# Patient Record
Sex: Male | Born: 1985 | Hispanic: No | Marital: Married
Health system: Southern US, Community
[De-identification: ages and names within clinical notes are randomized; demographics above are authoritative.]

## PROBLEM LIST (undated history)

## (undated) DIAGNOSIS — L309 Dermatitis, unspecified: Secondary | ICD-10-CM

## (undated) HISTORY — DX: Dermatitis, unspecified: L30.9

---
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
## 2007-10-05 ENCOUNTER — Emergency Department (HOSPITAL_COMMUNITY): Admission: EM | Admit: 2007-10-05 | Discharge: 2007-10-05 | Payer: Self-pay | Admitting: Emergency Medicine

## 2009-12-27 IMAGING — CR DG FOOT COMPLETE 3+V*L*
3 series · 3 of 3 positions shown · non-contrast
Comparison: No priors

CLINICAL DATA: Fell from a tree - pain in bilateral feet

LEFT FOOT - COMPLETE 3+ VIEW

[t foot ap left]
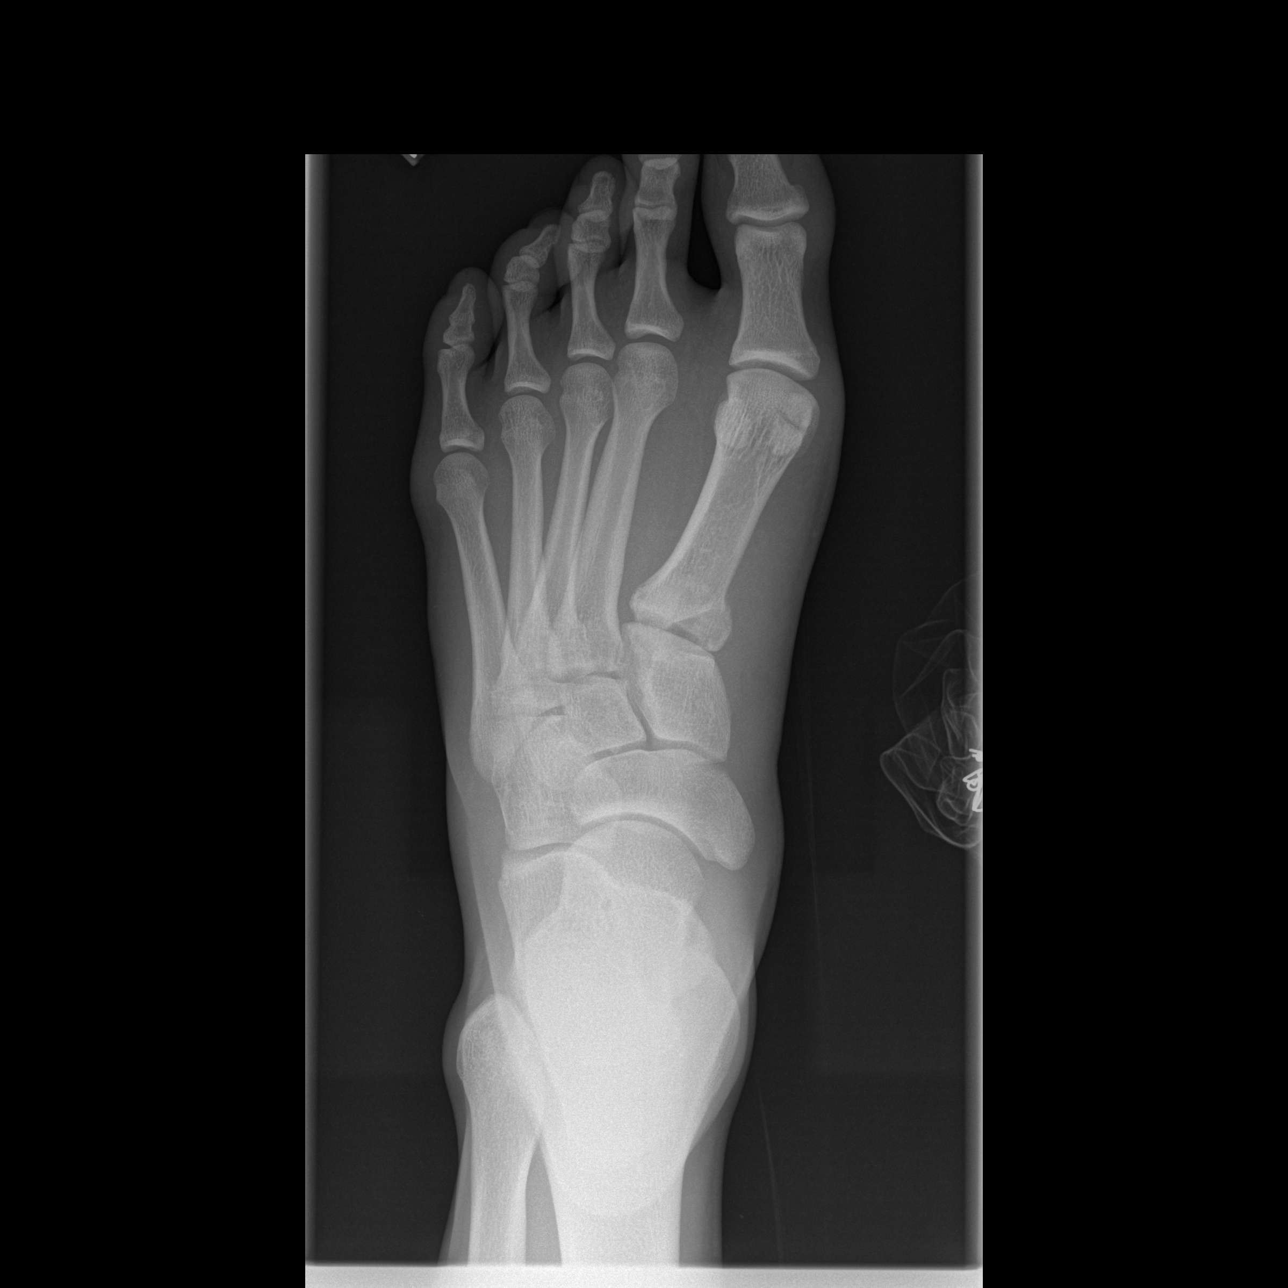

[t foot oblique left]
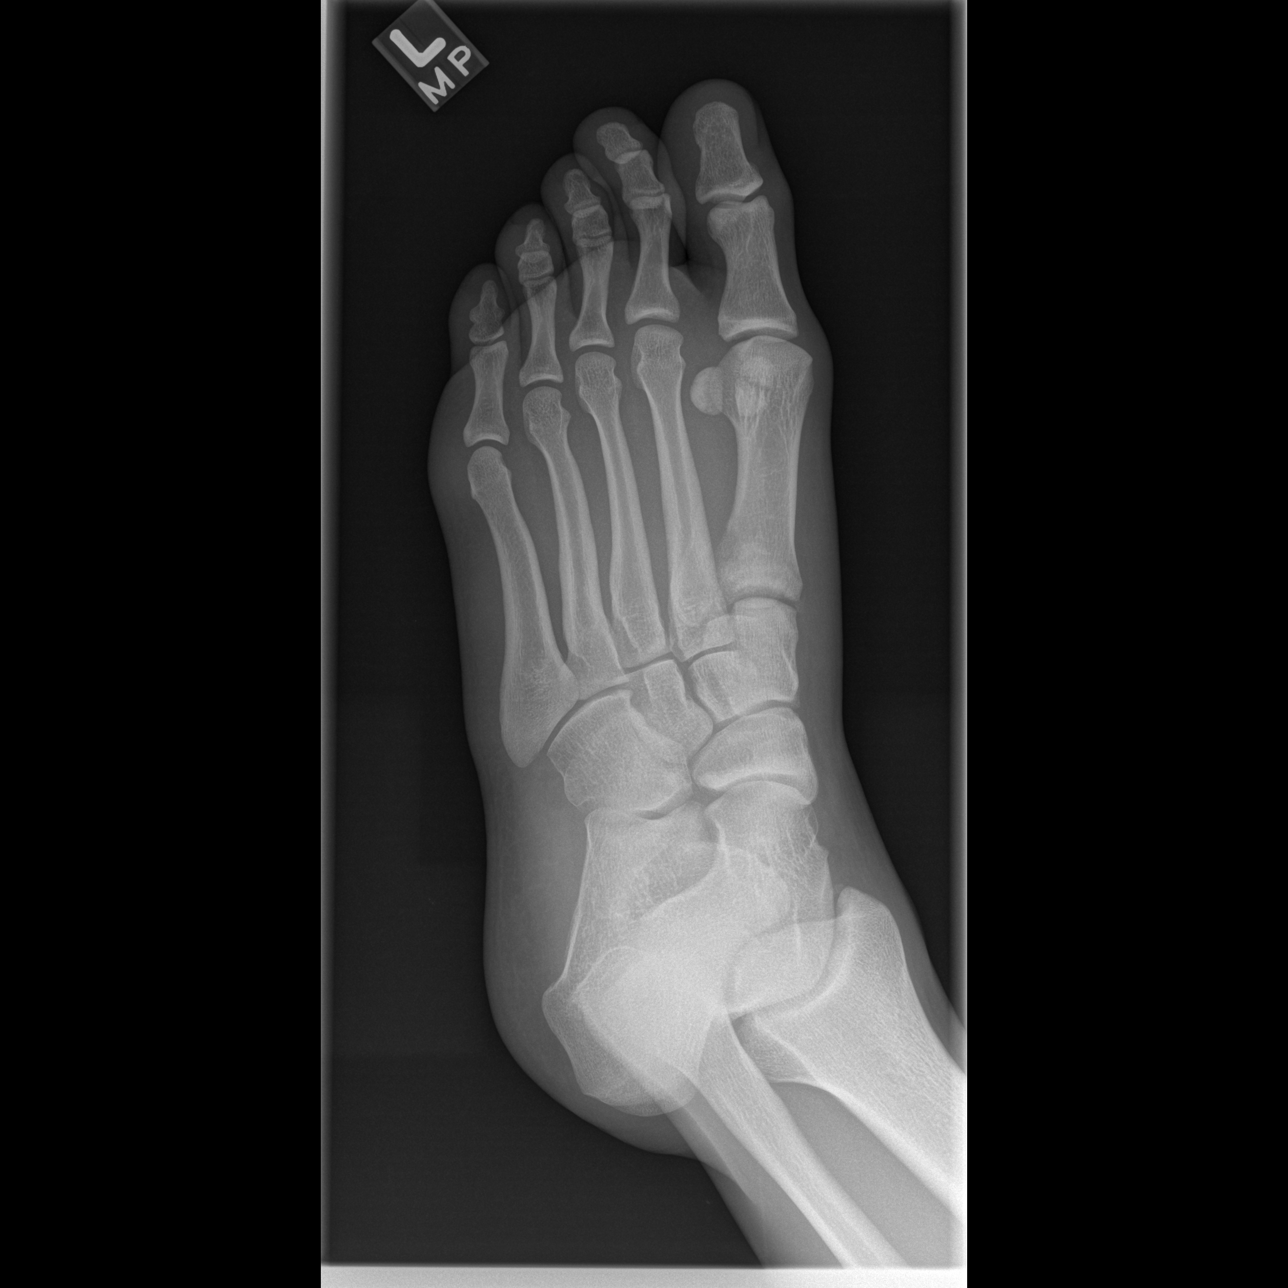

[t foot lat left]
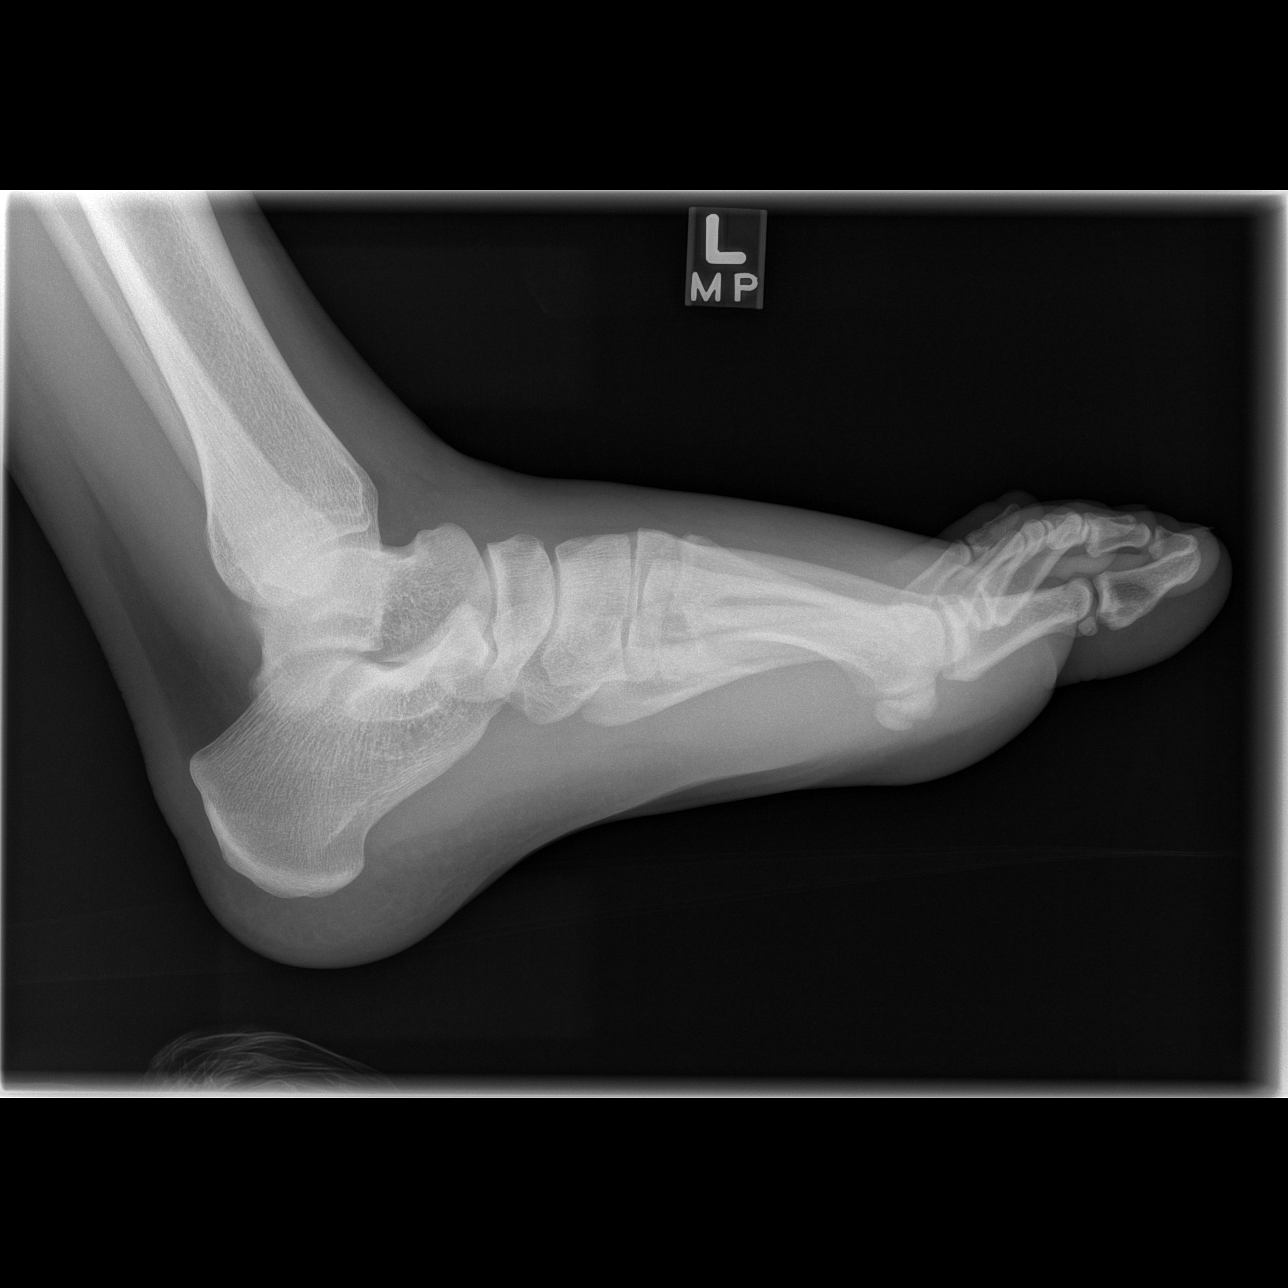

[3 of 3 positions shown; findings below may reference images not displayed]

FINDINGS: There is an abnormal contour to the base of the first
metatarsal suggesting a fracture.  Addition, there is possible
diastasis of the first and second cuneiforms. CT recommended for
further assessment.
IMPRESSION: 1.  Probable fracture at the base of the first metatarsal.
2.  Possible diastasis of the first and second cuneiforms.
3.  CT recommended for further assessment.

## 2009-12-27 IMAGING — CR DG FOOT COMPLETE 3+V*R*
3 series · 3 of 3 positions shown · non-contrast
Comparison: No priors

CLINICAL DATA: Fell out of tree - bilateral foot pain.

RIGHT FOOT COMPLETE - 3+ VIEW

[t foot ap right]
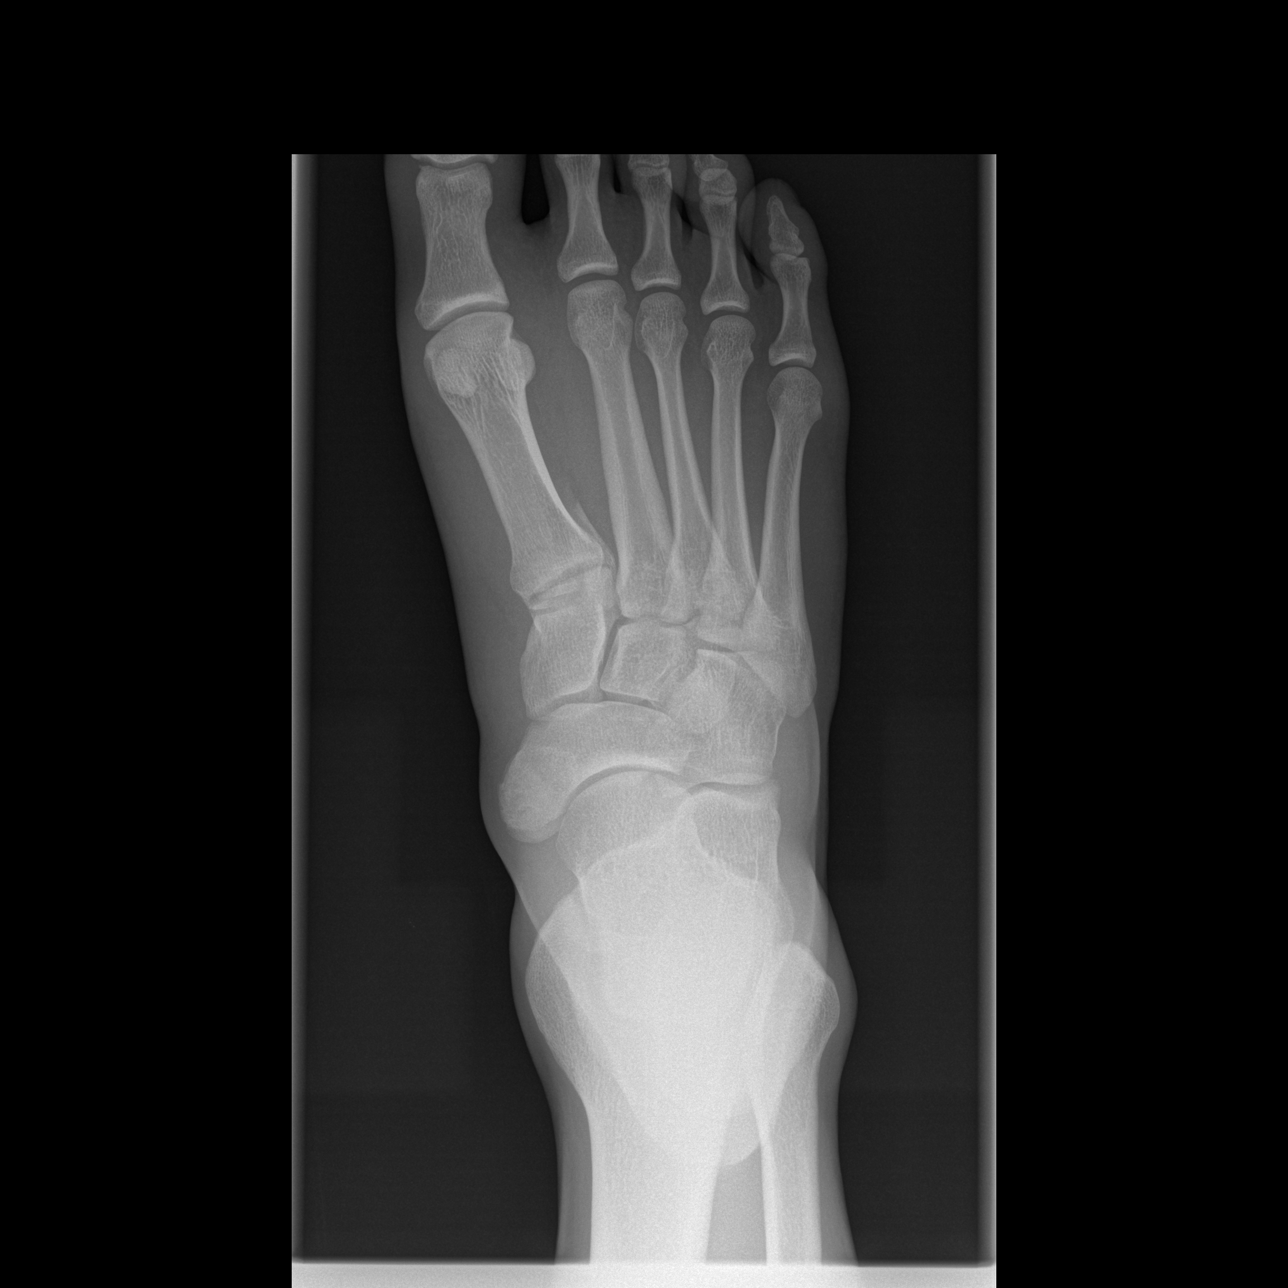

[t foot oblique right]
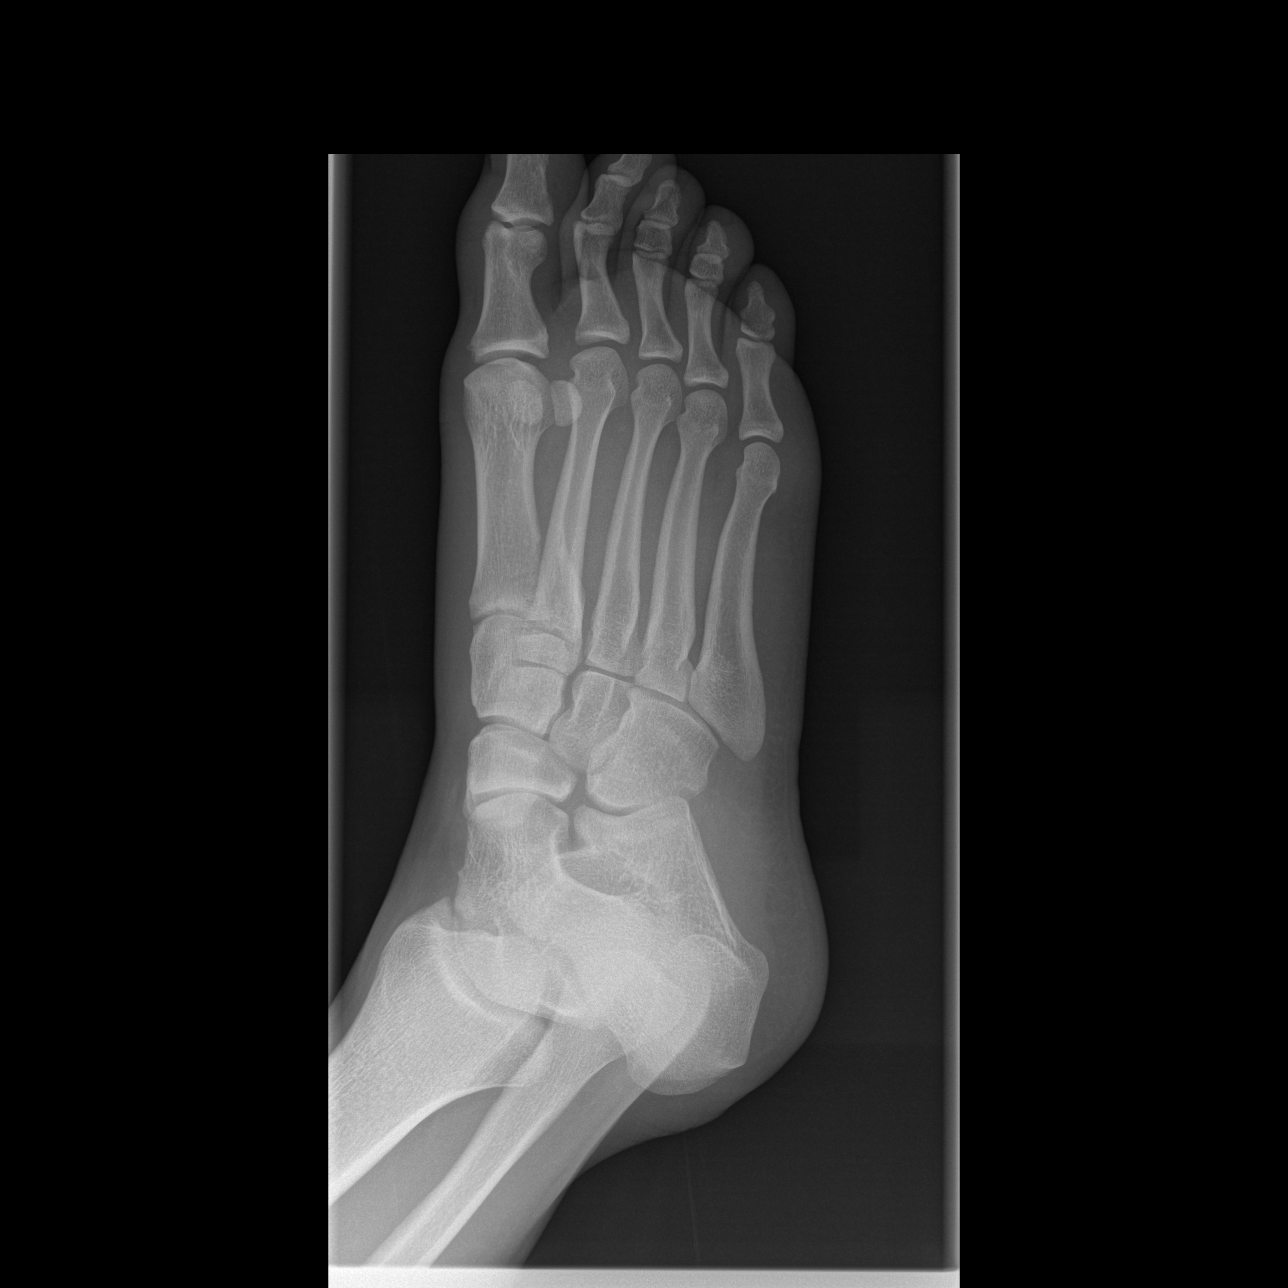

[t foot lat right]
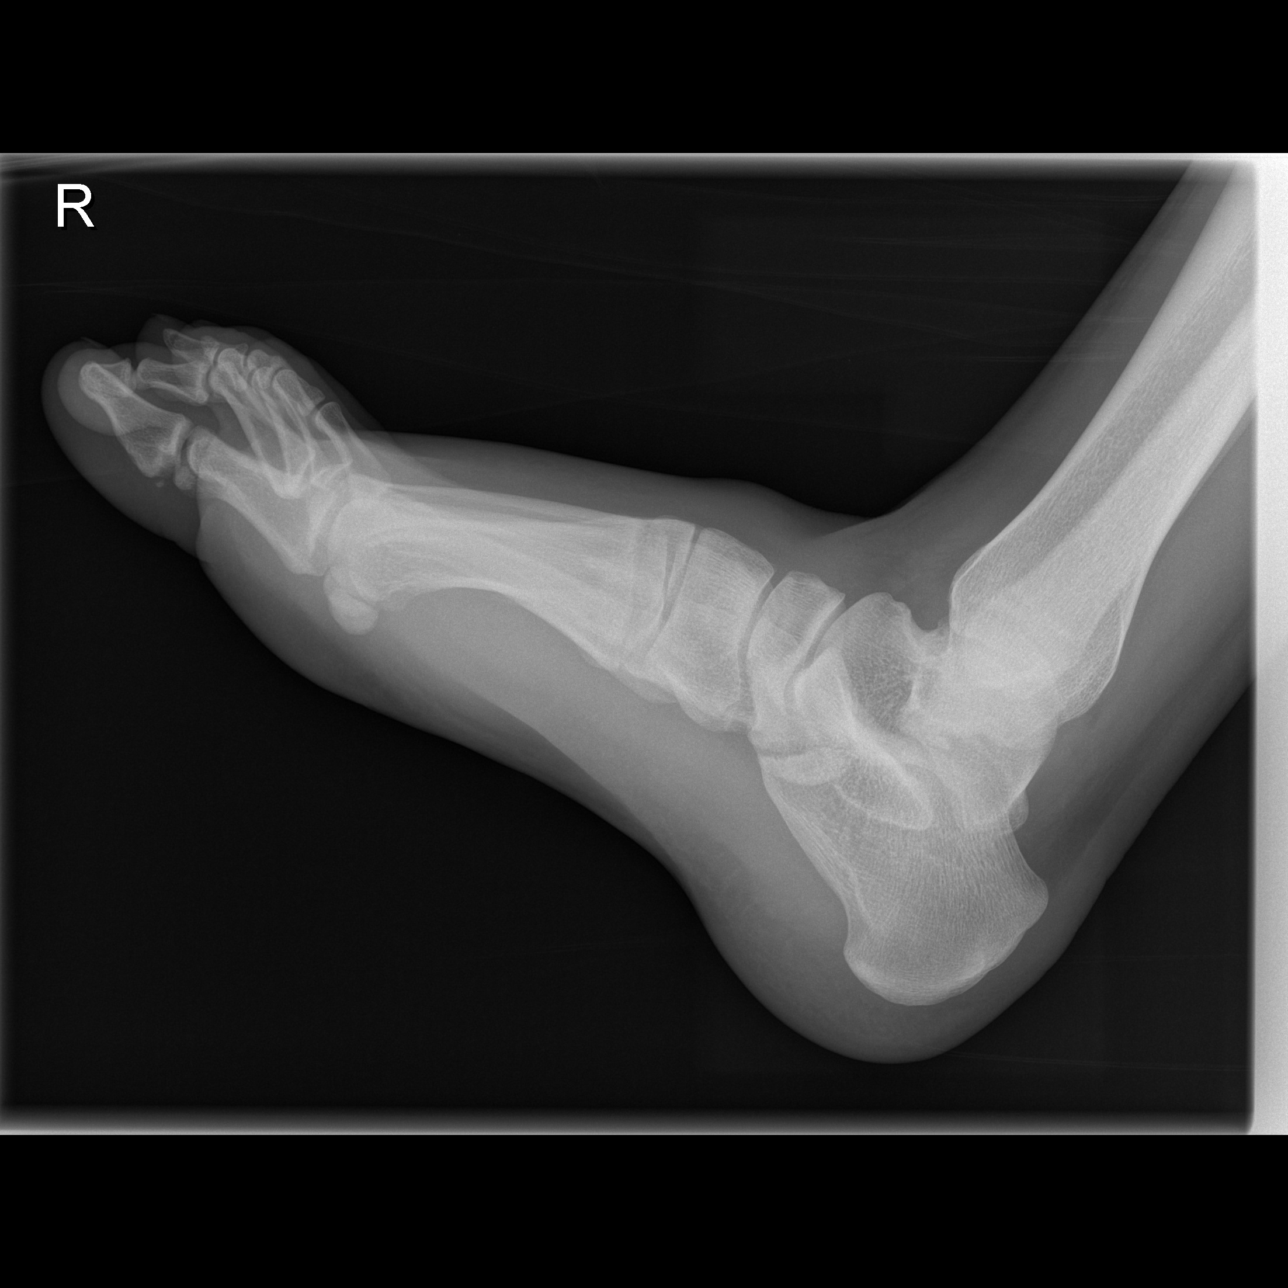

[3 of 3 positions shown; findings below may reference images not displayed]

FINDINGS: On the AP view, there is a fracture at the base of the
first metatarsal.  There is a possible fracture of the first
cuneiform.  CT recommended for further assessment.
IMPRESSION: 1.  There is a fracture at the base of the first metatarsal.
2.  Possible fracture of the first cuneiform.
3.  CT recommended for further assessment.a

## 2016-08-09 ENCOUNTER — Ambulatory Visit: Payer: BLUE CROSS/BLUE SHIELD | Admitting: Neurology

## 2016-08-09 ENCOUNTER — Telehealth: Payer: Self-pay

## 2016-08-09 NOTE — Telephone Encounter (Signed)
Pt did not show for their appt with Dr. Athar today.  

## 2016-08-10 ENCOUNTER — Encounter: Payer: Self-pay | Admitting: Neurology

## 2016-10-06 ENCOUNTER — Ambulatory Visit: Payer: BLUE CROSS/BLUE SHIELD | Admitting: Neurology

## 2016-11-30 ENCOUNTER — Ambulatory Visit: Payer: BLUE CROSS/BLUE SHIELD | Admitting: Neurology

## 2016-11-30 NOTE — Telephone Encounter (Signed)
Please follow dismissal protocol as per our New Pt No Show Policy.

## 2016-11-30 NOTE — Telephone Encounter (Signed)
Pt did not show for their appt with Dr. Frances FurbishAthar today.  This is pt's second new patient no show. Per [facility] policy, this is grounds for dismissal. Will send to Dr. Frances FurbishAthar for review.

## 2016-12-01 ENCOUNTER — Encounter: Payer: Self-pay | Admitting: Neurology
